# Patient Record
Sex: Male | Born: 1970 | Race: Black or African American | Hispanic: No | Marital: Single
Health system: Southern US, Community
[De-identification: ages and names within clinical notes are randomized; demographics above are authoritative.]

---

## 2020-01-18 ENCOUNTER — Emergency Department (HOSPITAL_COMMUNITY): Payer: HRSA Program

## 2020-01-18 ENCOUNTER — Emergency Department (HOSPITAL_COMMUNITY)
Admission: EM | Admit: 2020-01-18 | Discharge: 2020-01-18 | Disposition: A | Discharge status: Home / Self Care | Payer: HRSA Program | Attending: Emergency Medicine | Admitting: Emergency Medicine

## 2020-01-18 ENCOUNTER — Other Ambulatory Visit: Payer: Self-pay

## 2020-01-18 ENCOUNTER — Encounter (HOSPITAL_COMMUNITY): Payer: Self-pay

## 2020-01-18 DIAGNOSIS — R059 Cough, unspecified: Secondary | ICD-10-CM | POA: Diagnosis present

## 2020-01-18 DIAGNOSIS — U071 COVID-19: Secondary | ICD-10-CM | POA: Diagnosis not present

## 2020-01-18 DIAGNOSIS — Z20822 Contact with and (suspected) exposure to covid-19: Secondary | ICD-10-CM

## 2020-01-18 LAB — RESPIRATORY PANEL BY RT PCR (FLU A&B, COVID)
Influenza A by PCR: NEGATIVE
Influenza B by PCR: NEGATIVE
SARS Coronavirus 2 by RT PCR: POSITIVE — AB

## 2020-01-18 NOTE — ED Provider Notes (Signed)
[facility]-EMERGENCY DEPT Provider Note   CSN: [id number] Arrival date & time: 01/18/20  1259     History Chief Complaint  Patient presents with  . Generalized Body Aches  . Chills    Dwayne West. is a 49 y.o. male.  Presents ER with concern for body aches, chills, malaise.  States all this started last night.  Also having mild sore throat.  Today also noted cough, nonproductive.  No chest pain or difficulty in breathing.  No abdominal pain, vomiting.  Has not previously had Covid, has not previously been vaccinated against Covid.  No known fevers at home but was 100 F in ER. Denies medical problems. Smokes tobacco products.   HPI     History reviewed. No pertinent past medical history.  There are no problems to display for this patient.   History reviewed. No pertinent surgical history.     History reviewed. No pertinent family history.  Social History   Tobacco Use  . Smoking status: Not on file  Substance Use Topics  . Alcohol use: Not on file  . Drug use: Not on file    Home Medications Prior to Admission medications   Not on File    Allergies    Patient has no known allergies.  Review of Systems   Review of Systems  Constitutional: Positive for chills, fatigue and fever.  HENT: Negative for ear pain and sore throat.   Eyes: Negative for pain and visual disturbance.  Respiratory: Positive for cough. Negative for shortness of breath.   Cardiovascular: Negative for chest pain and palpitations.  Gastrointestinal: Negative for abdominal pain and vomiting.  Genitourinary: Negative for dysuria and hematuria.  Musculoskeletal: Positive for myalgias. Negative for arthralgias and back pain.  Skin: Negative for color change and rash.  Neurological: Negative for seizures and syncope.  All other systems reviewed and are negative.   Physical Exam Updated Vital Signs BP 118/76 (BP Location: Right Arm)   Pulse 91   Temp 100  F (37.8 C) (Oral)   Resp 16   SpO2 95%   Physical Exam Vitals and nursing note reviewed.  Constitutional:      Appearance: He is well-developed.  HENT:     Head: Normocephalic and atraumatic.     Mouth/Throat:     Mouth: Mucous membranes are moist.     Pharynx: Oropharynx is clear. No oropharyngeal exudate or posterior oropharyngeal erythema.  Eyes:     Conjunctiva/sclera: Conjunctivae normal.  Cardiovascular:     Rate and Rhythm: Normal rate and regular rhythm.     Heart sounds: No murmur heard.   Pulmonary:     Effort: Pulmonary effort is normal. No respiratory distress.     Breath sounds: Normal breath sounds.  Abdominal:     Palpations: Abdomen is soft.     Tenderness: There is no abdominal tenderness.  Musculoskeletal:        General: No deformity or signs of injury.     Cervical back: Neck supple.  Skin:    General: Skin is warm and dry.     Capillary Refill: Capillary refill takes less than 2 seconds.  Neurological:     General: No focal deficit present.     Mental Status: He is alert and oriented to person, place, and time.     ED Results / Procedures / Treatments   Labs (all labs ordered are listed, but only abnormal results are displayed) Labs Reviewed  RESPIRATORY PANEL BY RT  PCR (FLU A&B, COVID) - Abnormal; Notable for the following components:      Result Value   SARS Coronavirus 2 by RT PCR POSITIVE (*)    All other components within normal limits    EKG None  Radiology DG Chest Portable 1 View  Result Date: 01/18/2020 CLINICAL DATA:  Shortness of breath and fever EXAM: PORTABLE CHEST 1 VIEW COMPARISON:  None. FINDINGS: Lungs are clear. Heart size and pulmonary vascularity are normal. No adenopathy. No bone lesions. IMPRESSION: Lungs clear.  Cardiac silhouette normal. Electronically Signed   By: Bretta Bang III M.D.   On: 01/18/2020 14:10    Procedures Procedures (including critical care time)  Medications Ordered in ED Medications -  No data to display  ED Course  I have reviewed the triage vital signs and the nursing notes.  Pertinent labs & imaging results that were available during my care of the patient were reviewed by me and considered in my medical decision making (see chart for details).    MDM Rules/Calculators/A&P                         49 year old male who presented to ER with concern for cough, myalgias, malaise, chills.  On exam patient is remarkably well-appearing with normal vital signs, no distress, clear lungs.  CXR negative for pneumonia.  Suspect viral syndrome.  Will send for COVID-19 testing.  If positive patient will need to be contacted.  Dwayne West. was evaluated in Emergency Department on 01/18/2020 for the symptoms described in the history of present illness. He was evaluated in the context of the global COVID-19 pandemic, which necessitated consideration that the patient might be at risk for infection with the SARS-CoV-2 virus that causes COVID-19. Institutional protocols and algorithms that pertain to the evaluation of patients at risk for COVID-19 are in a state of rapid change based on information released by regulatory bodies including the CDC and federal and state organizations. These policies and algorithms were followed during the patient's care in the ED.     After the discussed management above, the patient was determined to be safe for discharge.  The patient was in agreement with this plan and all questions regarding their care were answered.  ED return precautions were discussed and the patient will return to the ED with any significant worsening of condition.   Final Clinical Impression(s) / ED Diagnoses Final diagnoses:  Suspected COVID-19 virus infection    Rx / DC Orders ED Discharge Orders    None       Milagros Loll, MD 01/18/20 1514

## 2020-01-18 NOTE — Discharge Instructions (Addendum)
We are concerned you may have COVID-19.  If this is positive, you will need to follow isolation precautions.  Please return to ER for chest pain, difficulty breathing or other new concerning symptom.

## 2020-01-18 NOTE — ED Triage Notes (Signed)
Pt reports generalized body aches, chills, and malaise that started last night. Pt denies being vaccinated for COVID.

## 2020-01-20 ENCOUNTER — Telehealth: Payer: Self-pay | Admitting: Nurse Practitioner

## 2020-01-20 DIAGNOSIS — U071 COVID-19: Secondary | ICD-10-CM

## 2020-01-20 NOTE — Telephone Encounter (Signed)
Called to discuss with Dwayne West. about Covid symptoms and the use of a monoclonal antibody infusion for those with mild to moderate Covid symptoms and at a high risk of hospitalization.     Pt is qualified for this infusion at the [facility] due to co-morbid conditions and/or a member of an at-risk group, however declines infusion at this time. Patient would like to think about infusion and will call back if he reconsiders.  Symptoms tier reviewed as well as criteria for ending isolation.  Symptoms reviewed that would warrant ED/Hospital evaluation. Preventative practices reviewed. Patient verbalized understanding. Patient advised to call back if he opts to proceed with infusion. Callback number provided. Urgent care and/or ER precautions given for severe symptoms. Last date eligible for infusion: 01/27/20   Smoker There are no problems to display for this patient.  Consuello Masse, NP [phone] [email or web address]

## 2021-03-03 IMAGING — DX DG CHEST 1V PORT
1 series · 1 of 1 positions shown · non-contrast
Comparison: None.

CLINICAL DATA: Shortness of breath and fever

EXAM:
PORTABLE CHEST 1 VIEW

[chest ap]
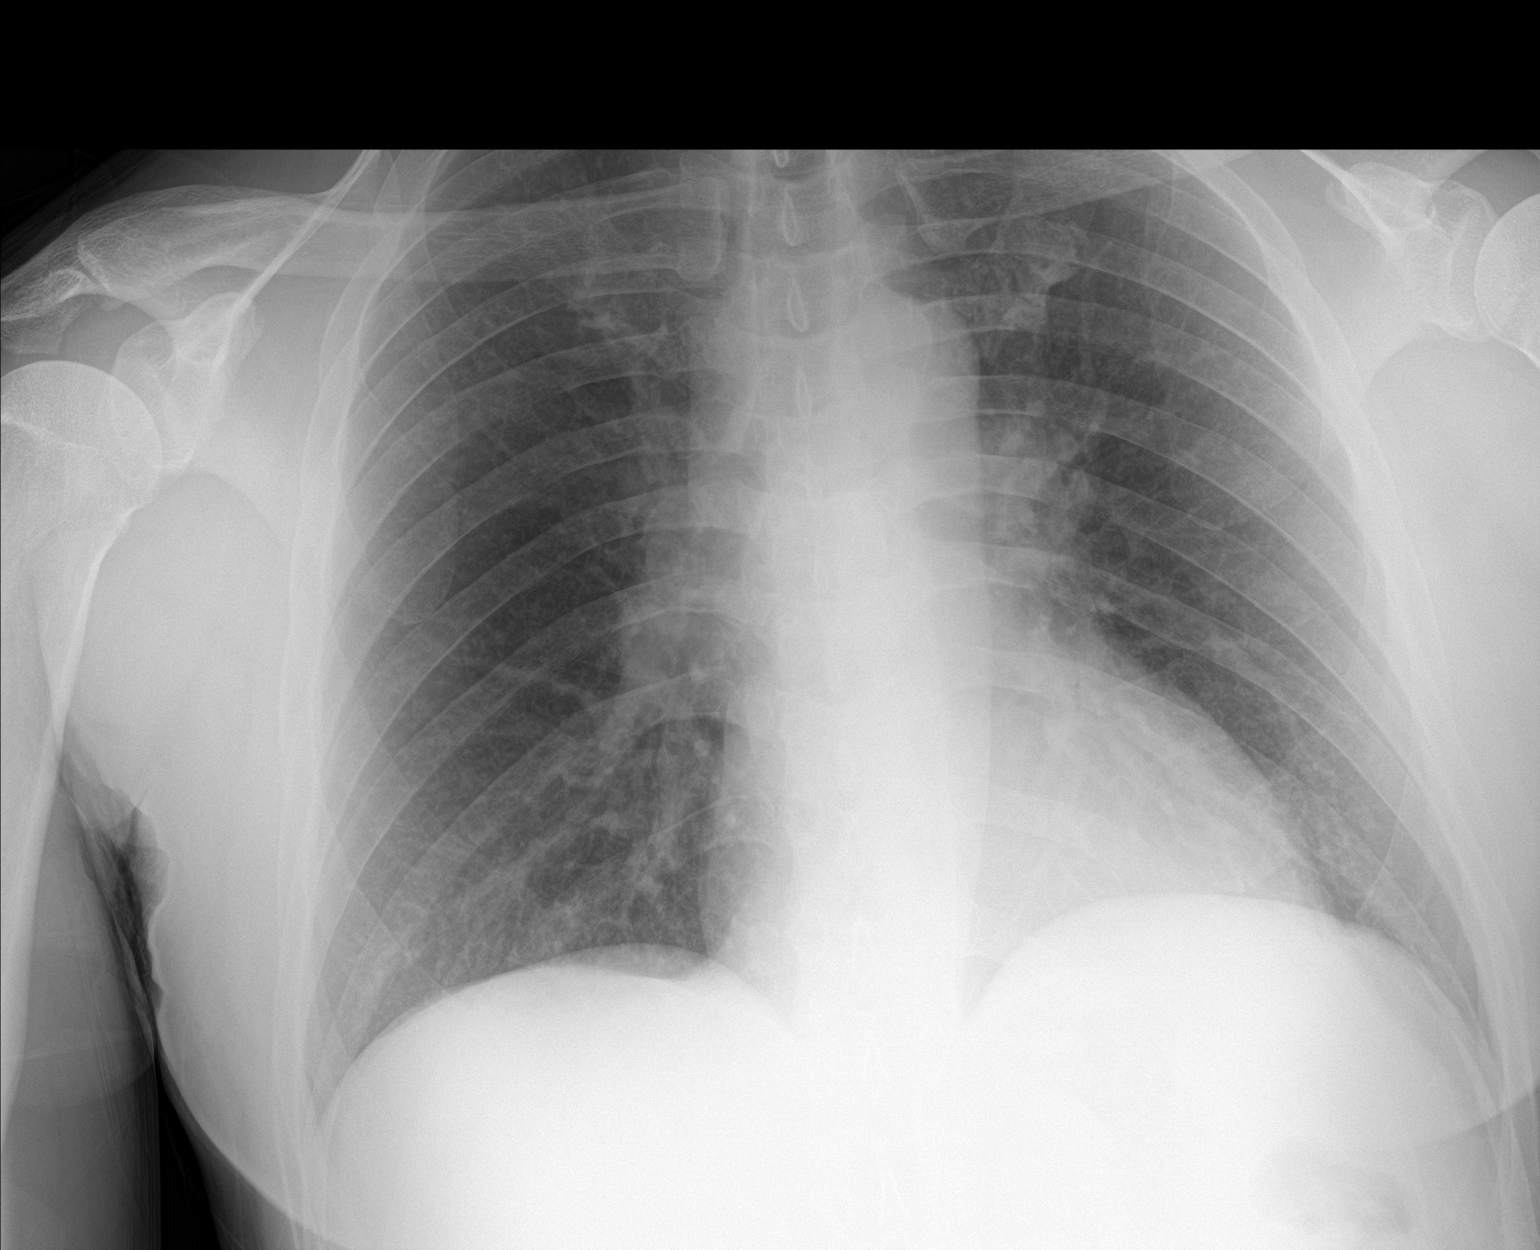

[1 of 1 positions shown; findings below may reference images not displayed]

FINDINGS: Lungs are clear. Heart size and pulmonary vascularity are normal. No
adenopathy. No bone lesions.
IMPRESSION: Lungs clear.  Cardiac silhouette normal.
